# Patient Record
Sex: Male | Born: 1955 | Race: White | Hispanic: No | Marital: Single | Smoking: Former smoker
Health system: Southern US, Community
[De-identification: ages and names within clinical notes are randomized; demographics above are authoritative.]

## PROBLEM LIST (undated history)

## (undated) DIAGNOSIS — K219 Gastro-esophageal reflux disease without esophagitis: Secondary | ICD-10-CM

## (undated) DIAGNOSIS — J939 Pneumothorax, unspecified: Secondary | ICD-10-CM

## (undated) HISTORY — DX: Gastro-esophageal reflux disease without esophagitis: K21.9

---

## 1999-12-19 HISTORY — PX: ANKLE FRACTURE SURGERY: SHX122

## 1999-12-31 ENCOUNTER — Emergency Department (HOSPITAL_COMMUNITY): Admission: EM | Admit: 1999-12-31 | Discharge: 1999-12-31 | Payer: Self-pay | Admitting: Emergency Medicine

## 1999-12-31 ENCOUNTER — Encounter: Payer: Self-pay | Admitting: Emergency Medicine

## 2000-01-01 ENCOUNTER — Ambulatory Visit (HOSPITAL_BASED_OUTPATIENT_CLINIC_OR_DEPARTMENT_OTHER): Admission: RE | Admit: 2000-01-01 | Discharge: 2000-01-01 | Payer: Self-pay | Admitting: Orthopedic Surgery

## 2000-01-09 ENCOUNTER — Ambulatory Visit (HOSPITAL_COMMUNITY): Admission: RE | Admit: 2000-01-09 | Discharge: 2000-01-09 | Payer: Self-pay | Admitting: Emergency Medicine

## 2009-11-02 ENCOUNTER — Emergency Department (HOSPITAL_COMMUNITY): Admission: EM | Admit: 2009-11-02 | Discharge: 2009-11-02 | Payer: Self-pay | Admitting: Emergency Medicine

## 2010-06-05 NOTE — Op Note (Signed)
[place]  Patient:    Ethan Colon, Ethan Colon                     MRN: [id number] Proc. Date: 01/01/00 Adm. Date:  [id number] Attending:  Sandi Raveling                           Operative Report  PREOPERATIVE DIAGNOSIS:  Displaced lateral malleolus fracture.  POSTOPERATIVE DIAGNOSIS:  Displaced lateral malleolus fracture.  OPERATION:  Open reduction and internal fixation lateral malleolus fracture with six-hole Ace titanium small-fragment semitubular plate.  SURGEON:  Sharlot Gowda., M.D.  ANESTHESIA:  General.  TOURNIQUET TIME:  58 minutes.  INDICATIONS:  A 55 year old, displaced lateral malleolus fracture, thought to be amenable to possible overnight hospitalization for fixation.  DESCRIPTION OF PROCEDURE:  Sterile prep and drape.  Exsanguination of the leg and placement through the tourniquet to 350.  Lateral incision made.  The fracture showed moderate posterior displacement and external rotation.  It was irrigated free of clot, reduced essentially anatomically, fixed with the six-hole plate with excellent purchase, distally three screws, including one screw placed with cancellous screw, two screws proximally, the third screw was at the tip of a fracture site and was left open.  Anatomic reduction was confirmed with good position of the screws in the AP, lateral, or oblique planes.  The wound was irrigated.  Vicryl 0 was used to close the deeper subcutaneous tissues, 2-0 Vicryl, skin clips, with Marcaine without epinephrine in the skin, into the joint, a total of 20 cc.  Lightly compressive sterile dressing and posterior plaster splint applied.  Taken to the recovery room in stable condition. DD:  01/01/00 TD:  01/01/00 Job: [id number]

## 2011-07-22 ENCOUNTER — Ambulatory Visit (INDEPENDENT_AMBULATORY_CARE_PROVIDER_SITE_OTHER): Payer: 59 | Admitting: Family Medicine

## 2011-07-22 VITALS — BP 122/78 | HR 77 | Temp 98.4°F | Resp 16 | Ht 72.0 in | Wt 154.0 lb

## 2011-07-22 DIAGNOSIS — J069 Acute upper respiratory infection, unspecified: Secondary | ICD-10-CM

## 2011-07-22 DIAGNOSIS — J329 Chronic sinusitis, unspecified: Secondary | ICD-10-CM

## 2011-07-22 MED ORDER — AMOXICILLIN 875 MG PO TABS: 875.0000 mg | ORAL_TABLET | Freq: Two times a day (BID) | ORAL | Status: AC

## 2011-07-22 MED ORDER — FLUTICASONE PROPIONATE 50 MCG/ACT NA SUSP: 2.0000 | Freq: Every day | NASAL | Status: DC

## 2011-07-22 NOTE — Patient Instructions (Addendum)
If any of fluids and get enough rest. Avoid smoke he, dusty environments.

## 2011-07-22 NOTE — Progress Notes (Signed)
Subjective: 56 year old man with a respiratory tract infection that started over the weekend, Saturday or Sunday. He was feeling a little better by Tuesday but then Tuesday night he got worse. He is continued to have a bad night last night with head congestion, postnasal drainage, some left cervical pain. No problems with his years. The cough is no major factor.  He does have a 54 and a half-year-old child who goes to daycare.  Objective: TMs are normal. Throat clear. Neck supple. Small tender node on the left. Chest is clear to auscultation. Heart regular.  Assessment: URI and sinusitis  Plan: Amoxicillin Flonase nasal spray  Return for

## 2011-07-23 ENCOUNTER — Telehealth: Payer: Self-pay

## 2011-07-23 MED ORDER — GUAIFENESIN-CODEINE 100-10 MG/5ML PO SOLN
ORAL | Status: AC
Start: 2011-07-23 — End: 2011-08-02

## 2011-07-23 NOTE — Telephone Encounter (Signed)
PT STATES HE WAS SEEN YESTERDAY AND WANTED TO MAKE SURE HE HAD TOLD THE DR ABOUT HIS SYMPTONS, STATES HE COUGHED FOR 3 HRS STRAIGHT LAST NIGHT AND FELT MISERABLE. PLEASE CALL [phone]

## 2011-07-23 NOTE — Telephone Encounter (Signed)
Pt CB to report that he didn't give the MD this info yesterday at OV, but he is actually coughing a lot at night - it was worse last night and he coughed for 3 hrs bf he could stop to the point of causing himself to vomit. Pt requests a cough syrup for use at night so that he can sleep. I advised pt to also use Mucinex and drink plenty of water and that propping himself up to sleep can also help some. Can we send a cough syrup to [facility] pharm at [facility]?

## 2011-07-23 NOTE — Telephone Encounter (Signed)
Done and printed

## 2011-07-24 NOTE — Telephone Encounter (Signed)
Pt notified and rx faxed in 

## 2011-09-27 ENCOUNTER — Ambulatory Visit: Payer: 59

## 2011-09-27 ENCOUNTER — Ambulatory Visit (INDEPENDENT_AMBULATORY_CARE_PROVIDER_SITE_OTHER): Payer: 59 | Admitting: Family Medicine

## 2011-09-27 VITALS — BP 135/86 | HR 90 | Temp 98.3°F | Resp 16 | Ht 68.0 in | Wt 192.0 lb

## 2011-09-27 DIAGNOSIS — R059 Cough, unspecified: Secondary | ICD-10-CM

## 2011-09-27 DIAGNOSIS — R05 Cough: Secondary | ICD-10-CM

## 2011-09-27 DIAGNOSIS — J45909 Unspecified asthma, uncomplicated: Secondary | ICD-10-CM

## 2011-09-27 MED ORDER — ALBUTEROL SULFATE (2.5 MG/3ML) 0.083% IN NEBU
2.5000 mg | INHALATION_SOLUTION | Freq: Once | RESPIRATORY_TRACT | Status: AC
  Administered 2011-09-27: 2.5 mg via RESPIRATORY_TRACT

## 2011-09-27 MED ORDER — HYDROCODONE-HOMATROPINE 5-1.5 MG/5ML PO SYRP: ORAL_SOLUTION | ORAL | Status: AC

## 2011-09-27 MED ORDER — AZITHROMYCIN 250 MG PO TABS: ORAL_TABLET | ORAL | Status: AC

## 2011-09-27 MED ORDER — ALBUTEROL SULFATE HFA 108 (90 BASE) MCG/ACT IN AERS: 2.0000 | INHALATION_SPRAY | RESPIRATORY_TRACT | Status: AC | PRN

## 2011-09-27 NOTE — Progress Notes (Signed)
Patient ID: Ethan Colon MRN: [id number], DOB: 1955-05-23, 56 y.o. Date of Encounter: 09/27/2011, 8:13 PM  Primary Physician: No primary provider on file.  Chief Complaint:  Chief Complaint  Patient presents with  . URI    5-6 days  . Cough    HPI: 56 y.o. year old male presents with a five to six day history of nasal congestion, post nasal drip, and cough. No sinus pressure. Afebrile. No chills. Nasal congestion has since resolved. Cough is mostly non-productive and worse at night when he lays down. Cough is keeping him awake at night. Several days prior he did lose his voice with this illness, but has since regained this. Normal hearing. Has tried OTC cold preps without success. No GI complaints. Appetite normal. Wife was ill with same symptoms prior to patient becoming sick with the above. 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 He also mentions to me that many years ago he suffered from a spontaneous pneumothorax on the left side. His symptom at that time was mid back pain. He states that about one week ago he did have a similar pain, although not as bad. This pain has since resolved and he has not had it since. He denies any shortness of breath, dyspnea, wheezing, or chest tightness.   No recent antibiotics, or recent travels.   No leg trauma, sedentary periods, h/o cancer, or tobacco use.  No past medical history on file.   Home Meds: Prior to Admission medications   Medication Sig Start Date End Date Taking? Authorizing Provider  fluticasone (FLONASE) 50 MCG/ACT nasal spray Place 2 sprays into the nose daily. 07/22/11 07/21/12  Peyton Najjar, MD    Allergies:  Allergies  Allergen Reactions  . Erythromycin Other (See Comments)    Abdominal pain    History   Social History  . Marital Status: Single    Spouse Name: N/A    Number of Children: N/A  . Years of Education: N/A   Occupational History  . Not on file.   Social History Main  Topics  . Smoking status: Former Games developer  . Smokeless tobacco: Not on file  . Alcohol Use: Not on file  . Drug Use: Not on file  . Sexually Active: Not on file   Other Topics Concern  . Not on file   Social History Narrative  . No narrative on file     Review of Systems: Constitutional: negative for chills, fever, night sweats or weight changes Cardiovascular: negative for chest pain or palpitations Respiratory: negative for hemoptysis, wheezing, or shortness of breath Abdominal: negative for abdominal pain, nausea, vomiting or diarrhea Dermatological: negative for rash Neurologic: negative for headache   Physical Exam: Blood pressure 135/86, pulse 90, temperature 98.3 F (36.8 C), temperature source Oral, resp. rate 16, height 5\' 8"  (1.727 m), weight 192 lb (87.091 kg)., Body mass index is 29.19 kg/(m^2). General: Well developed, well nourished, in no acute distress. Head: Normocephalic, atraumatic, eyes without discharge, sclera non-icteric, nares are congested. Bilateral auditory canals clear, TM's are without perforation, pearly grey with reflective cone of light bilaterally. No sinus TTP. Oral cavity moist, dentition normal. Posterior pharynx with post nasal drip and mild erythema. No peritonsillar abscess or tonsillar exudate. Neck: Supple. No thyromegaly. Full ROM. No lymphadenopathy. Lungs: Coarse breath sounds bilaterally with mild expiratory wheezing. No rales or rhonchi. Breathing is unlabored.  Heart: RRR with S1 S2. No murmurs, rubs, or gallops appreciated. Msk:  Strength and tone normal for age. Extremities: No clubbing or cyanosis. No edema. Neuro: Alert and oriented X 3. Moves all extremities spontaneously. CNII-XII grossly in tact. Psych:  Responds to questions appropriately with a normal affect.   [facility] reading (PRIMARY) by  Dr. Jenness Corner. Blunted left costophenrenic angle with multiple blebs within the apex of each lung without definite sign of a  pneumothorax.   S/P Albuterol neb: No further wheezing to auscultation.    ASSESSMENT AND PLAN:  56 y.o. year old male with asthmatic bronchitis. -Azithromycin 250 MG #6 2 po first day then 1 po next 4 days no RF -Hycodan #4oz 1 tsp po q 4-6 hours prn cough no RF SED -Proventil 2 puffs inhaled q 4-6 hours prn #1 RF 2 -If no better in 48-72 hours can call for Prednisone 20 mg 3x2, 2x2, 1x2 -Mucinex -Tylenol/Motrin prn -Rest/fluids -RTC precautions -RTC 3-5 days if no improvement  Elinor Dodge, PA-C 09/27/2011 8:13 PM

## 2012-02-04 ENCOUNTER — Emergency Department (HOSPITAL_COMMUNITY)
Admission: EM | Admit: 2012-02-04 | Discharge: 2012-02-05 | Disposition: A | Discharge status: Home / Self Care | Payer: 59 | Attending: Gastroenterology | Admitting: Gastroenterology

## 2012-02-04 ENCOUNTER — Encounter (HOSPITAL_COMMUNITY)
Admission: EM | Disposition: A | Discharge status: Home / Self Care | Payer: Self-pay | Source: Home / Self Care | Attending: Emergency Medicine

## 2012-02-04 ENCOUNTER — Encounter (HOSPITAL_COMMUNITY): Payer: Self-pay | Admitting: *Deleted

## 2012-02-04 DIAGNOSIS — T18108A Unspecified foreign body in esophagus causing other injury, initial encounter: Secondary | ICD-10-CM

## 2012-02-04 DIAGNOSIS — J45909 Unspecified asthma, uncomplicated: Secondary | ICD-10-CM

## 2012-02-04 DIAGNOSIS — R059 Cough, unspecified: Secondary | ICD-10-CM

## 2012-02-04 DIAGNOSIS — Y9389 Activity, other specified: Secondary | ICD-10-CM | POA: Insufficient documentation

## 2012-02-04 DIAGNOSIS — Z87891 Personal history of nicotine dependence: Secondary | ICD-10-CM | POA: Insufficient documentation

## 2012-02-04 DIAGNOSIS — IMO0002 Reserved for concepts with insufficient information to code with codable children: Secondary | ICD-10-CM | POA: Insufficient documentation

## 2012-02-04 DIAGNOSIS — Z79899 Other long term (current) drug therapy: Secondary | ICD-10-CM | POA: Insufficient documentation

## 2012-02-04 DIAGNOSIS — Z7982 Long term (current) use of aspirin: Secondary | ICD-10-CM | POA: Insufficient documentation

## 2012-02-04 DIAGNOSIS — Y929 Unspecified place or not applicable: Secondary | ICD-10-CM | POA: Insufficient documentation

## 2012-02-04 DIAGNOSIS — R05 Cough: Secondary | ICD-10-CM

## 2012-02-04 HISTORY — PX: ESOPHAGOGASTRODUODENOSCOPY: SHX5428

## 2012-02-04 HISTORY — DX: Pneumothorax, unspecified: J93.9

## 2012-02-04 LAB — BASIC METABOLIC PANEL
BUN: 16 mg/dL (ref 6–23)
Calcium: 9.9 mg/dL (ref 8.4–10.5)
Creatinine, Ser: 0.87 mg/dL (ref 0.50–1.35)
GFR calc non Af Amer: >90 mL/min (ref >90)
Glucose, Bld: 103 mg/dL — ABNORMAL HIGH (ref 70–99)
Sodium: 137 mEq/L (ref 135–145)

## 2012-02-04 LAB — CBC WITH DIFFERENTIAL/PLATELET
Eosinophils Absolute: 0.2 10*3/uL (ref 0.0–0.7)
Eosinophils Relative: 2 % (ref 0–5)
Lymphs Abs: 2 10*3/uL (ref 0.7–4.0)
MCH: 33.6 pg (ref 26.0–34.0)
MCV: 91.2 fL (ref 78.0–100.0)
Monocytes Absolute: 0.7 10*3/uL (ref 0.1–1.0)
Monocytes Relative: 7 % (ref 3–12)
Platelets: 356 10*3/uL (ref 150–400)
RBC: 4.88 MIL/uL (ref 4.22–5.81)

## 2012-02-04 SURGERY — EGD (ESOPHAGOGASTRODUODENOSCOPY)
Anesthesia: Moderate Sedation

## 2012-02-04 MED ORDER — SODIUM CHLORIDE 0.9 % IV BOLUS (SEPSIS)
1000.0000 mL | Freq: Once | INTRAVENOUS | Status: AC
  Administered 2012-02-04: 1000 mL via INTRAVENOUS

## 2012-02-04 MED ORDER — ONDANSETRON HCL 4 MG/2ML IJ SOLN
4.0000 mg | Freq: Once | INTRAMUSCULAR | Status: AC
  Administered 2012-02-04: 4 mg via INTRAVENOUS
  Filled 2012-02-04: qty 2

## 2012-02-04 MED ORDER — GLUCAGON HCL (RDNA) 1 MG IJ SOLR
1.0000 mg | Freq: Once | INTRAMUSCULAR | Status: AC
  Administered 2012-02-04: 1 mg via INTRAVENOUS
  Filled 2012-02-04: qty 1

## 2012-02-04 MED ORDER — PANTOPRAZOLE SODIUM 40 MG IV SOLR
40.0000 mg | Freq: Once | INTRAVENOUS | Status: AC
  Administered 2012-02-04: 40 mg via INTRAVENOUS
  Filled 2012-02-04: qty 40

## 2012-02-04 NOTE — ED Provider Notes (Signed)
History     CSN: [id number]  Arrival date & time 02/04/12  2125   First MD Initiated Contact with Patient 02/04/12 2245      Chief Complaint  Patient presents with  . Airway Obstruction    (Consider location/radiation/quality/duration/timing/severity/associated sxs/prior treatment) HPI.... sensation of foreign body stuck in his throat.  Patient was eating steak at noon today when he felt like food bolus stopped midway down his esophagus. He has been able to swallow fluids since that time. No airway issues. He is generally healthy.  History reviewed. No pertinent past medical history.  History reviewed. No pertinent past surgical history.  History reviewed. No pertinent family history.  History  Substance Use Topics  . Smoking status: Former Games developer  . Smokeless tobacco: Not on file  . Alcohol Use: Yes      Review of Systems  All other systems reviewed and are negative.    Allergies  Erythromycin  Home Medications   Current Outpatient Rx  Name  Route  Sig  Dispense  Refill  . ASPIRIN EC 81 MG PO TBEC   Oral   Take 81 mg by mouth daily.         Marland Kitchen CALCIUM 1200 PO   Oral   Take 1 tablet by mouth daily.         . OMEGA-3-ACID ETHYL ESTERS 1 G PO CAPS   Oral   Take 1 g by mouth daily.         . ALBUTEROL SULFATE HFA 108 (90 BASE) MCG/ACT IN AERS   Inhalation   Inhale 2 puffs into the lungs every 4 (four) hours as needed for wheezing.   1 Inhaler   2     BP 170/93  Pulse 72  Temp 98.2 F (36.8 C) (Oral)  Resp 18  SpO2 96%  Physical Exam  Nursing note and vitals reviewed. Constitutional: He is oriented to person, place, and time. He appears well-developed and well-nourished.  HENT:  Head: Normocephalic and atraumatic.  Eyes: Conjunctivae normal and EOM are normal. Pupils are equal, round, and reactive to light.  Neck: Normal range of motion. Neck supple.  Cardiovascular: Normal rate, regular rhythm and normal heart sounds.   Pulmonary/Chest:  Effort normal and breath sounds normal.  Abdominal: Soft. Bowel sounds are normal.  Musculoskeletal: Normal range of motion.  Neurological: He is alert and oriented to person, place, and time.  Skin: Skin is warm and dry.  Psychiatric: He has a normal mood and affect.    ED Course  Procedures (including critical care time)   Labs Reviewed  CBC WITH DIFFERENTIAL  BASIC METABOLIC PANEL   No results found.   1. Foreign body in esophagus       MDM  IV fluids, IV glucagon, discussions with the gastroenterologist Dr Dorena Cookey.  He will evaluate patient        Donnetta Hutching, MD 02/04/12 2318

## 2012-02-04 NOTE — ED Notes (Signed)
Pt states ate steak for lunch, has had foreign body sensation in throat since first eating. Pt states he has "vomited" x 25 times. Pt states unable to swallow saliva. Pt is in no acute respiratory distress at this time. Pt is unable to eat or drink.

## 2012-02-04 NOTE — ED Notes (Signed)
Patient transported to Endoscopy for procedure with RN

## 2012-02-05 ENCOUNTER — Encounter (HOSPITAL_COMMUNITY)
Admission: EM | Disposition: A | Discharge status: Home / Self Care | Payer: Self-pay | Source: Home / Self Care | Attending: Emergency Medicine

## 2012-02-05 ENCOUNTER — Encounter (HOSPITAL_COMMUNITY): Payer: Self-pay | Admitting: *Deleted

## 2012-02-05 HISTORY — PX: ESOPHAGOGASTRODUODENOSCOPY: SHX5428

## 2012-02-05 SURGERY — EGD (ESOPHAGOGASTRODUODENOSCOPY)
Anesthesia: Moderate Sedation

## 2012-02-05 MED ORDER — MIDAZOLAM HCL 10 MG/2ML IJ SOLN
INTRAMUSCULAR | Status: DC | PRN
  Administered 2012-02-05 (×2): 2 mg via INTRAVENOUS

## 2012-02-05 MED ORDER — BUTAMBEN-TETRACAINE-BENZOCAINE 2-2-14 % EX AERO
INHALATION_SPRAY | CUTANEOUS | Status: DC | PRN
  Administered 2012-02-05: 1 via TOPICAL

## 2012-02-05 MED ORDER — FENTANYL CITRATE 0.05 MG/ML IJ SOLN
INTRAMUSCULAR | Status: DC | PRN
  Administered 2012-02-05 (×2): 25 ug via INTRAVENOUS

## 2012-02-05 NOTE — H&P (Signed)
[facility] Consult Note  Referring Provider: No ref. provider found Primary Care Physician:  No primary provider on file. Primary Gastroenterologist:  Dr.  Antony Contras Complaint: Feeling of food stuck in the esophagus HPI: Ethan Colon is an 57 y.o. white male  was eating steak and salad at 12 and then and felt the food bolus lodged in his esophagus and has been unable to clear it and unable to swallow his saliva sounds. He has had minor episodes of dysphagia in the past but never has had a prolonged impaction requiring her to come to the emergency room.  Past Medical History  Diagnosis Date  . Pneumothorax     History reviewed. No pertinent past surgical history.  Medications Prior to Admission  Medication Sig Dispense Refill  . aspirin EC 81 MG tablet Take 81 mg by mouth daily.      . Calcium Carbonate-Vit D-Min (CALCIUM 1200 PO) Take 1 tablet by mouth daily.      Marland Kitchen omega-3 acid ethyl esters (LOVAZA) 1 G capsule Take 1 g by mouth daily.      Marland Kitchen albuterol (PROVENTIL HFA;VENTOLIN HFA) 108 (90 BASE) MCG/ACT inhaler Inhale 2 puffs into the lungs every 4 (four) hours as needed for wheezing.  1 Inhaler  2    Allergies:  Allergies  Allergen Reactions  . Erythromycin Other (See Comments)    Abdominal pain    History reviewed. No pertinent family history.  Social History:  reports that he has quit smoking. He does not have any smokeless tobacco history on file. He reports that he drinks alcohol. He reports that he does not use illicit drugs.  Review of Systems: negative except as above   Blood pressure 170/93, pulse 72, temperature 98.2 F (36.8 C), temperature source Oral, resp. rate 18, SpO2 96.00%. Head: Normocephalic, without obvious abnormality, atraumatic Neck: no adenopathy, no carotid bruit, no JVD, supple, symmetrical, trachea midline and thyroid not enlarged, symmetric, no tenderness/mass/nodules Resp: clear to auscultation bilaterally Cardio: regular rate  and rhythm, S1, S2 normal, no murmur, click, rub or gallop GI: Abdomen soft nondistended with normoactive bowel sounds Extremities: extremities normal, atraumatic, no cyanosis or edema  Results for orders placed during the hospital encounter of 02/04/12 (from the past 48 hour(s))  CBC WITH DIFFERENTIAL     Status: Abnormal   Collection Time   02/04/12 11:00 PM      Component Value Range Comment   WBC 9.6  4.0 - 10.5 K/uL    RBC 4.88  4.22 - 5.81 MIL/uL    Hemoglobin 16.4  13.0 - 17.0 g/dL    HCT 16.1  09.6 - 04.5 %    MCV 91.2  78.0 - 100.0 fL    MCH 33.6  26.0 - 34.0 pg    MCHC 36.9 (*) 30.0 - 36.0 g/dL    RDW 40.9  81.1 - 91.4 %    Platelets 356  150 - 400 K/uL    Neutrophils Relative 70  43 - 77 %    Neutro Abs 6.8  1.7 - 7.7 K/uL    Lymphocytes Relative 21  12 - 46 %    Lymphs Abs 2.0  0.7 - 4.0 K/uL    Monocytes Relative 7  3 - 12 %    Monocytes Absolute 0.7  0.1 - 1.0 K/uL    Eosinophils Relative 2  0 - 5 %    Eosinophils Absolute 0.2  0.0 - 0.7 K/uL    Basophils Relative 1  0 - 1 %    Basophils Absolute 0.1  0.0 - 0.1 K/uL   BASIC METABOLIC PANEL     Status: Abnormal   Collection Time   02/04/12 11:00 PM      Component Value Range Comment   Sodium 137  135 - 145 mEq/L    Potassium 4.1  3.5 - 5.1 mEq/L    Chloride 99  96 - 112 mEq/L    CO2 27  19 - 32 mEq/L    Glucose, Bld 103 (*) 70 - 99 mg/dL    BUN 16  6 - 23 mg/dL    Creatinine, Ser 4.01  0.50 - 1.35 mg/dL    Calcium 9.9  8.4 - 02.7 mg/dL    GFR calc non Af Amer >90  >90 mL/min    GFR calc Af Amer >90  >90 mL/min    No results found.  Assessment: Food impaction in the esophagus unable to clear spontaneously I Plan:  Will proceed with EGD with attempted removal. Yaniv Lage C 02/05/2012, 12:11 AM

## 2012-02-05 NOTE — Op Note (Signed)
[facility] [address] [place], [address]   ayesENDOSCOPY PROCEDURE REPORT  PATIENT: Ethan Colon, Ethan Colon  MR#: [id number] BIRTHDATE: 1955-04-16 , 56  yrs. old GENDER: Male ENDOSCOPIST:Cleave Ternes Madilyn Fireman, MD REFERRED BY: PROCEDURE DATE:  02/05/2012 PROCEDURE: ASA CLASS: INDICATIONS:  esophageal food impaction MEDICATION:    fentanyl 50 mcg, Versed 4 mg TOPICAL ANESTHETIC:  DESCRIPTION OF PROCEDURE:   esophagus filled with fluid with distal meat bolus pushed through the GE junction into the stomach with friable surrounding mucosa no obvious laceration a probable patent lower esophageal ring with fracture with the scope. Stomach was normal. Duodenum was not examined     COMPLICATIONS: None  ENDOSCOPIC IMPRESSION:sessile removal of foreign body from esophagus  RECOMMENDATIONS: with underlying esophageal ringplan is to followup with me in the office and to arrange elective dilatation.    _______________________________ Rosalie DoctorDorena Cookey, MD 02/05/2012 12:45 AM

## 2012-02-05 NOTE — ED Notes (Signed)
Pt returned from Endoscopy unit. Pt is alert/oriented. Will continue to monitor. Pt spouse will be coming to take him home. Pt given water, able to swallow without difficulty

## 2012-02-05 NOTE — ED Notes (Signed)
[id number]<AV> Expected date:<BR> Expected time:<BR> Means of arrival:<BR> Comments:<BR> ENDO PT

## 2012-02-08 ENCOUNTER — Encounter (HOSPITAL_COMMUNITY): Payer: Self-pay | Admitting: Gastroenterology

## 2012-02-09 MED FILL — Diphenhydramine HCl Inj 50 MG/ML: INTRAMUSCULAR | Qty: 1 | Status: AC

## 2012-03-04 ENCOUNTER — Other Ambulatory Visit: Payer: Self-pay

## 2012-10-09 ENCOUNTER — Ambulatory Visit (INDEPENDENT_AMBULATORY_CARE_PROVIDER_SITE_OTHER): Payer: 59 | Admitting: Emergency Medicine

## 2012-10-09 VITALS — BP 128/82 | HR 71 | Temp 98.9°F | Resp 17 | Ht 68.0 in | Wt 185.0 lb

## 2012-10-09 DIAGNOSIS — J018 Other acute sinusitis: Secondary | ICD-10-CM

## 2012-10-09 MED ORDER — PSEUDOEPHEDRINE-GUAIFENESIN ER 60-600 MG PO TB12: 1.0000 | ORAL_TABLET | Freq: Two times a day (BID) | ORAL | Status: AC

## 2012-10-09 MED ORDER — AMOXICILLIN-POT CLAVULANATE 875-125 MG PO TABS: 1.0000 | ORAL_TABLET | Freq: Two times a day (BID) | ORAL | Status: DC

## 2012-10-09 NOTE — Patient Instructions (Addendum)
Hoarseness Hoarseness is produced from a variety of causes. It is important to find the cause so it can be treated. In the absence of a cold or upper respiratory illness, any hoarseness lasting more than 2 weeks should be looked at by a specialist. This is especially important if you have a history of smoking or alcohol use. It is also important to keep in mind that as you grow older, your voice will naturally get weaker, making it easier for you to become hoarse from straining your vocal cords.  CAUSES  Any illness that affects your vocal cords can result in a hoarse voice. Examples of conditions that can affect the vocal cords are listed as follows:   Allergies.  Colds.  Sinusitis.  Gastroesophageal reflux disease.  Croup.  Injury.  Nodules.  Exposure to smoke or toxic fumes or gases.  Congenital and genetic defects.  Paralysis of the vocal cords.  Infections.  Advanced age. DIAGNOSIS  In order to diagnose the cause of your hoarseness, your caregiver will examine your throat using an instrument that uses a tube with a small lighted camera (laryngoscope). It allows your caregiver to look into the mouth and down the throat. TREATMENT  For most cases, treatment will focus on the specific cause of the hoarseness. Depending on the cause, hoarseness can be a temporary condition (acute) or it can be long lasting (chronic). Most cases of hoarseness clear up without complications. Your caregiver will explain to you if this is not likely to happen. SEEK IMMEDIATE MEDICAL CARE IF:   You have increasing hoarseness or loss of voice.  You have shortness of breath.  You are coughing up blood.  There is pain in your neck or throat. Document Released: 12/18/2004 Document Revised: 03/29/2011 Document Reviewed: 03/12/2010 [facility] Patient Information 2014 [place], [place]. Sinusitis Sinusitis is redness, soreness, and swelling (inflammation) of the paranasal sinuses. Paranasal sinuses are  air pockets within the bones of your face (beneath the eyes, the middle of the forehead, or above the eyes). In healthy paranasal sinuses, mucus is able to drain out, and air is able to circulate through them by way of your nose. However, when your paranasal sinuses are inflamed, mucus and air can become trapped. This can allow bacteria and other germs to grow and cause infection. Sinusitis can develop quickly and last only a short time (acute) or continue over a long period (chronic). Sinusitis that lasts for more than 12 weeks is considered chronic.  CAUSES  Causes of sinusitis include:  Allergies.  Structural abnormalities, such as displacement of the cartilage that separates your nostrils (deviated septum), which can decrease the air flow through your nose and sinuses and affect sinus drainage.  Functional abnormalities, such as when the small hairs (cilia) that line your sinuses and help remove mucus do not work properly or are not present. SYMPTOMS  Symptoms of acute and chronic sinusitis are the same. The primary symptoms are pain and pressure around the affected sinuses. Other symptoms include:  Upper toothache.  Earache.  Headache.  Bad breath.  Decreased sense of smell and taste.  A cough, which worsens when you are lying flat.  Fatigue.  Fever.  Thick drainage from your nose, which often is green and may contain pus (purulent).  Swelling and warmth over the affected sinuses. DIAGNOSIS  Your caregiver will perform a physical exam. During the exam, your caregiver may:  Look in your nose for signs of abnormal growths in your nostrils (nasal polyps).  Tap over  the affected sinus to check for signs of infection.  View the inside of your sinuses (endoscopy) with a special imaging device with a light attached (endoscope), which is inserted into your sinuses. If your caregiver suspects that you have chronic sinusitis, one or more of the following tests may be  recommended:  Allergy tests.  Nasal culture A sample of mucus is taken from your nose and sent to a lab and screened for bacteria.  Nasal cytology A sample of mucus is taken from your nose and examined by your caregiver to determine if your sinusitis is related to an allergy. TREATMENT  Most cases of acute sinusitis are related to a viral infection and will resolve on their own within 10 days. Sometimes medicines are prescribed to help relieve symptoms (pain medicine, decongestants, nasal steroid sprays, or saline sprays).  However, for sinusitis related to a bacterial infection, your caregiver will prescribe antibiotic medicines. These are medicines that will help kill the bacteria causing the infection.  Rarely, sinusitis is caused by a fungal infection. In theses cases, your caregiver will prescribe antifungal medicine. For some cases of chronic sinusitis, surgery is needed. Generally, these are cases in which sinusitis recurs more than 3 times per year, despite other treatments. HOME CARE INSTRUCTIONS   Drink plenty of water. Water helps thin the mucus so your sinuses can drain more easily.  Use a humidifier.  Inhale steam 3 to 4 times a day (for example, sit in the bathroom with the shower running).  Apply a warm, moist washcloth to your face 3 to 4 times a day, or as directed by your caregiver.  Use saline nasal sprays to help moisten and clean your sinuses.  Take over-the-counter or prescription medicines for pain, discomfort, or fever only as directed by your caregiver. SEEK IMMEDIATE MEDICAL CARE IF:  You have increasing pain or severe headaches.  You have nausea, vomiting, or drowsiness.  You have swelling around your face.  You have vision problems.  You have a stiff neck.  You have difficulty breathing. MAKE SURE YOU:   Understand these instructions.  Will watch your condition.  Will get help right away if you are not doing well or get worse. Document  Released: 01/04/2005 Document Revised: 03/29/2011 Document Reviewed: 01/19/2011 [facility] Patient Information 2014 [place], [place].

## 2012-10-09 NOTE — Progress Notes (Signed)
[facility] [address], [place] [address] [phone]  Date:  10/09/2012   Name:  Ethan Colon   DOB:  Apr 24, 1955   MRN:  [id number]  PCP:  No primary provider on file.    Chief Complaint: Laryngitis   History of Present Illness:  Ethan Colon is a 57 y.o. very pleasant male patient who presents with the following:  Ill since Friday with hoarseness.  Has no dysphagia.  Has mucoid nasal discharge and a post nasal drainage with a thick mucopurulent drainage.  No fever or chills.  No nausea or vomiting.  No stool change,  Hoarse.  Productive cough. Had a food impaction in his distal esophagus in January and failed to follow up.  Has waterbrash at times no heartburn.  No fever or chills.  No food intolerance.  No improvement with over the counter medications or other home remedies. Denies other complaint or health concern today.   There are no active problems to display for this patient.   Past Medical History  Diagnosis Date  . Pneumothorax   . GERD (gastroesophageal reflux disease)     Past Surgical History  Procedure Laterality Date  . Esophagogastroduodenoscopy  02/04/2012    Procedure: ESOPHAGOGASTRODUODENOSCOPY (EGD);  Surgeon: Barrie Folk, MD;  Location: [facility];  Service: Endoscopy;  Laterality: N/A;  . Esophagogastroduodenoscopy  02/05/2012    Procedure: ESOPHAGOGASTRODUODENOSCOPY (EGD);  Surgeon: Barrie Folk, MD;  Location: [facility];  Service: Endoscopy;  Laterality: N/A;    History  Substance Use Topics  . Smoking status: Former Games developer  . Smokeless tobacco: Not on file  . Alcohol Use: Yes     Comment: 2 Drinks daily    History reviewed. No pertinent family history.  Allergies  Allergen Reactions  . Erythromycin Other (See Comments)    Abdominal pain    Medication list has been reviewed and updated.  Current Outpatient Prescriptions on File Prior to Visit  Medication Sig Dispense Refill  . albuterol (PROVENTIL  HFA;VENTOLIN HFA) 108 (90 BASE) MCG/ACT inhaler Inhale 2 puffs into the lungs every 4 (four) hours as needed for wheezing.  1 Inhaler  2  . aspirin EC 81 MG tablet Take 81 mg by mouth daily.      . Calcium Carbonate-Vit D-Min (CALCIUM 1200 PO) Take 1 tablet by mouth daily.      Marland Kitchen omega-3 acid ethyl esters (LOVAZA) 1 G capsule Take 1 g by mouth daily.       No current facility-administered medications on file prior to visit.    Review of Systems:  As per HPI, otherwise negative.    Physical Examination: Filed Vitals:   10/09/12 1115  BP: 128/82  Pulse: 71  Temp: 98.9 F (37.2 C)  Resp: 17   Filed Vitals:   10/09/12 1115  Height: 5\' 8"  (1.727 m)  Weight: 185 lb (83.915 kg)   Body mass index is 28.14 kg/(m^2). Ideal Body Weight: Weight in (lb) to have BMI = 25: 164.1  GEN: WDWN, NAD, Non-toxic, A & O x 3 HEENT: Atraumatic, Normocephalic. Neck supple. No masses, No LAD. Ears and Nose: No external deformity.  Purulent nasal drainage CV: RRR, No M/G/R. No JVD. No thrill. No extra heart sounds. PULM: CTA B, no wheezes, crackles, rhonchi. No retractions. No resp. distress. No accessory muscle use. ABD: S, NT, ND, +BS. No rebound. No HSM. EXTR: No c/c/e NEURO Normal gait.  PSYCH: Normally interactive. Conversant. Not depressed or anxious  appearing.  Calm demeanor.    Assessment and Plan: Sinusitis augmentin mucinex d  Signed,  Phillips Odor, MD

## 2012-11-23 ENCOUNTER — Other Ambulatory Visit: Payer: Self-pay

## 2013-07-30 ENCOUNTER — Ambulatory Visit (INDEPENDENT_AMBULATORY_CARE_PROVIDER_SITE_OTHER): Payer: 59 | Admitting: Family Medicine

## 2013-07-30 ENCOUNTER — Ambulatory Visit (INDEPENDENT_AMBULATORY_CARE_PROVIDER_SITE_OTHER): Payer: 59

## 2013-07-30 VITALS — BP 142/74 | HR 78 | Temp 97.9°F | Resp 15 | Ht 67.5 in | Wt 190.6 lb

## 2013-07-30 DIAGNOSIS — M546 Pain in thoracic spine: Secondary | ICD-10-CM

## 2013-07-30 MED ORDER — DICLOFENAC SODIUM 75 MG PO TBEC: 75.0000 mg | DELAYED_RELEASE_TABLET | Freq: Two times a day (BID) | ORAL | Status: DC

## 2013-07-30 MED ORDER — CYCLOBENZAPRINE HCL 5 MG PO TABS: ORAL_TABLET | ORAL | Status: DC

## 2013-07-30 NOTE — Progress Notes (Signed)
Subjective: 58 year old male who is here with a back pain that has been nagging him for the last week or so. No specific injury. 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 He has to oral a bag of steaks around with him that he drives and around the hammer. He has been hurting in the mid back between the lower aspect of the scapula. Mostly midline but seems a little more to the right at times. He has had some pains in his shoulder and arm. When his wife rubbed it last week it seemed to be a little more tender. However the pains come and go, shooting pains. He is taken some ibuprofen. He has continued working. He also wanted to make sure the moles on his back looked okay.  And anxiety in the family comes from the fact that his sister-in-law had mid back pains that is not being an aortic aneurysm.  Objective: Healthy-appearing man in no major distress at this time. Good range of motion of upper extremities with good strength. Normal range of motion of the neck, but does feel some crepitance when he moves around. Thoracic spine essentially nontender. Maybe minimal tenderness just to the right of midline at about the T9 or T10 level. Abdomen soft. The skin has a lot of little freckle-like moles on the back. Is 1 area of coarse her skin just to the left of midline in the mid back that appears to be early seborrheic keratosis.  Assessment: Back pain, mid back Nonspecific nevi, benign in appearance Probable seborrheic keratosis, benign in appearance  Plan: [facility] reading (PRIMARY) by  Dr. Alwyn RenHopper Normal t spine .   Thoracic spine x-ray

## 2013-07-30 NOTE — Patient Instructions (Addendum)
Take diclofenac one twice daily at breakfast and supper for pain and inflammation of back  If you have additional pain you can take acetaminophen/Tylenol but avoid ibuprofen and Aleve type products while on this diclofenac  The cyclobenzaprine is a muscle relaxant take one or 2 at bedtime. It often causes some daytime drowsiness  Return if not improving  Read the Back Owner's Manual

## 2013-09-22 IMAGING — CR DG CHEST 2V
2 series · 2 of 2 positions shown · non-contrast
Comparison: None.

CLINICAL DATA: Cough

CHEST - 2 VIEW

[PA]
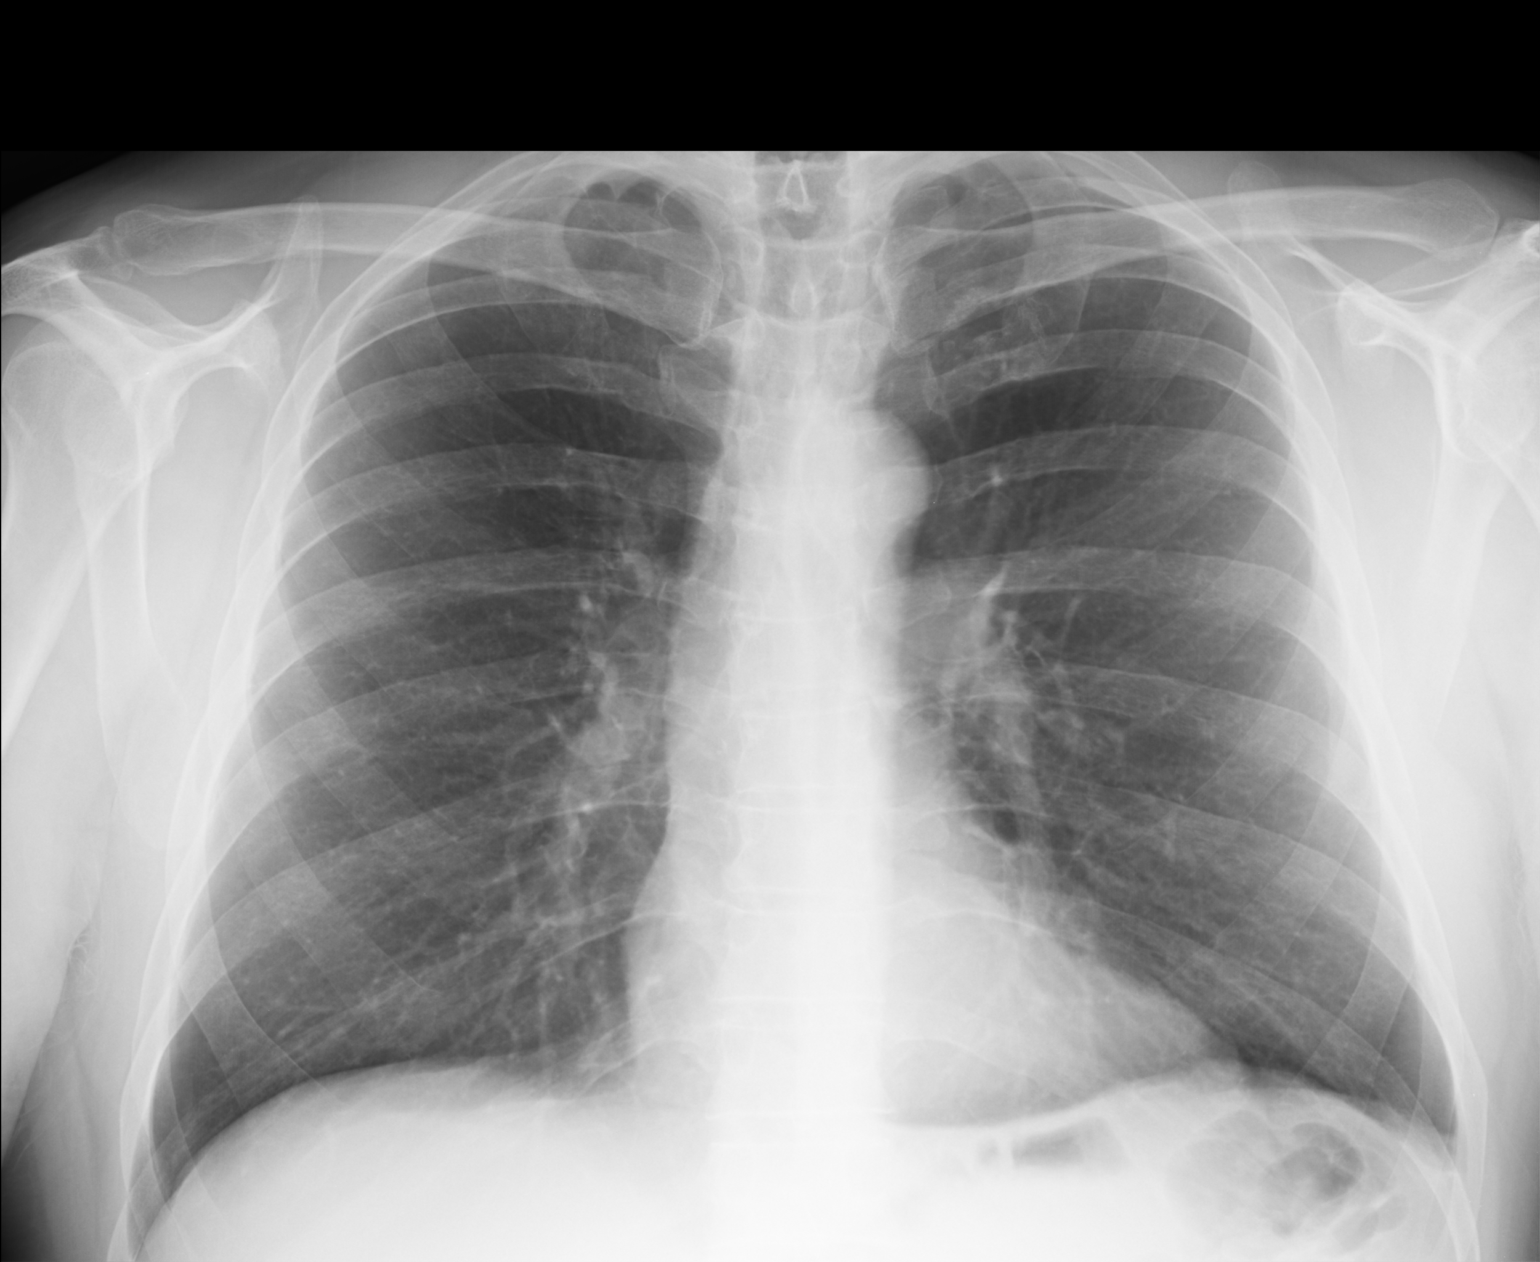

[lateral]
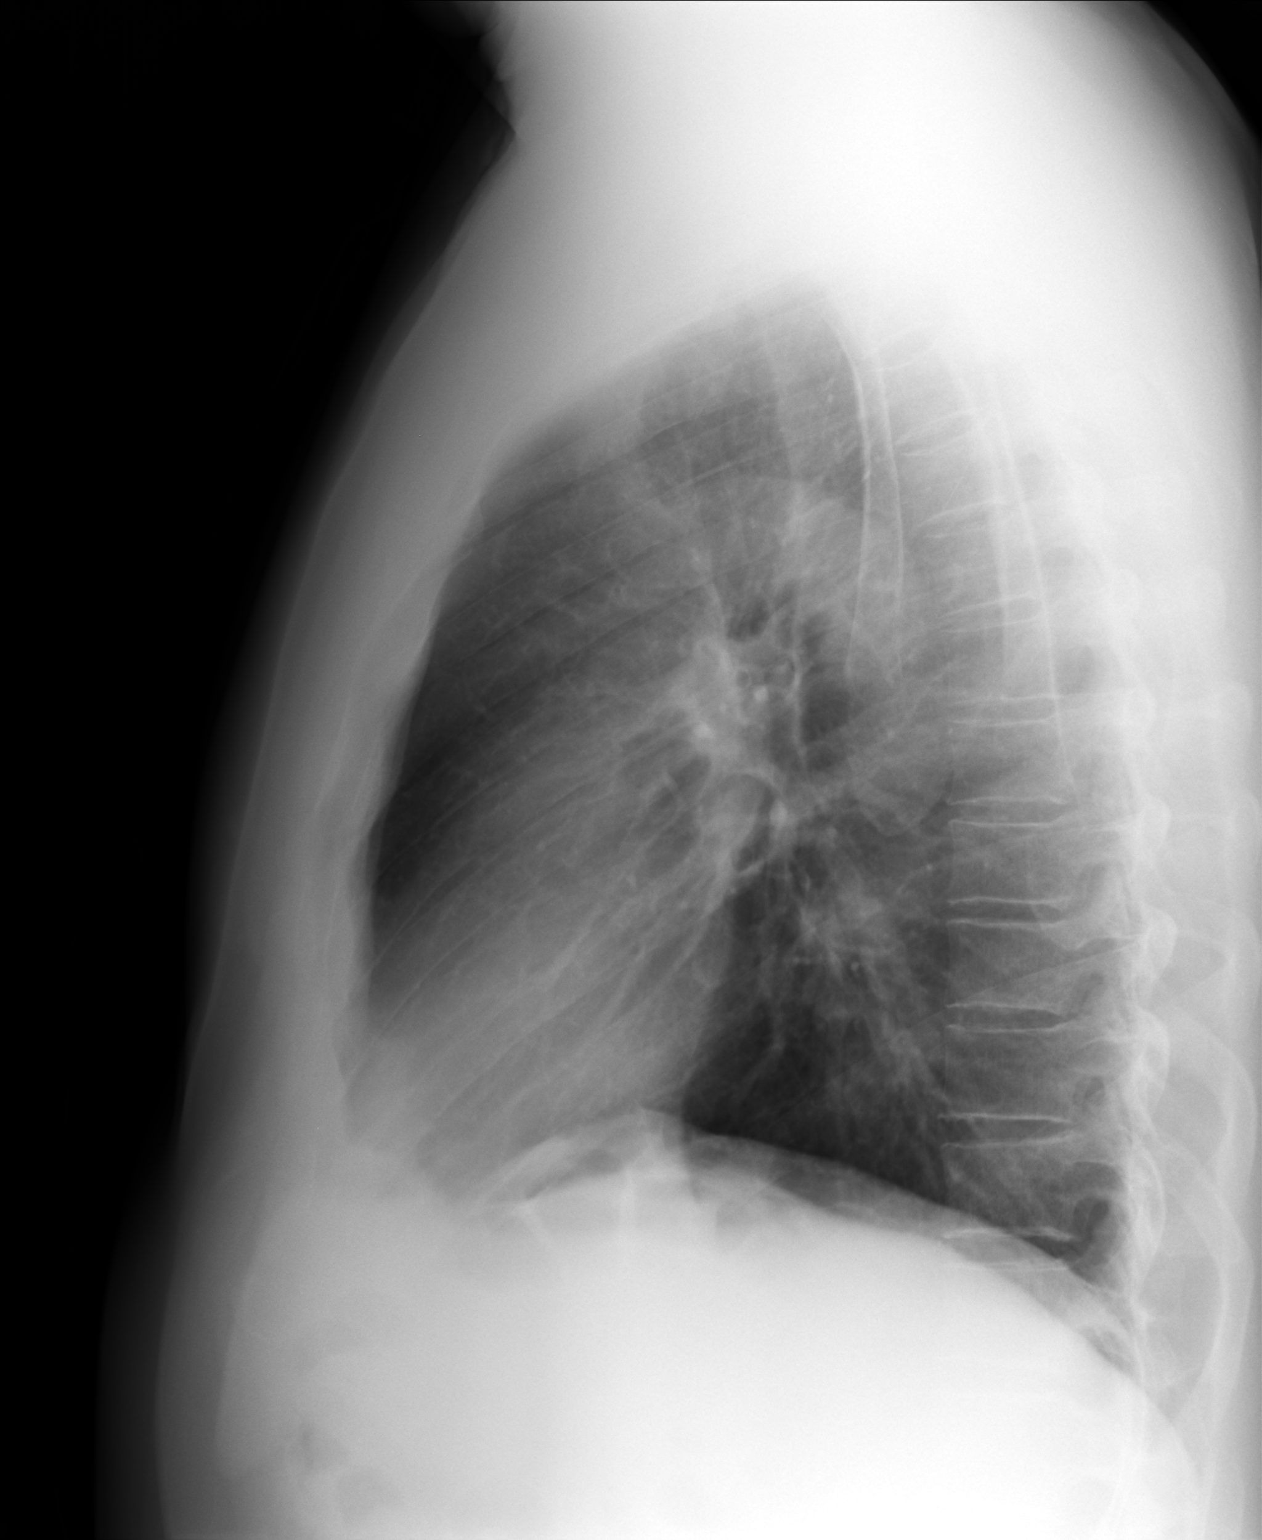

[2 of 2 positions shown; findings below may reference images not displayed]

FINDINGS: No active infiltrate or effusion is seen.  There may be
small blebs in the right lung apex.  No pneumothorax is seen.  The
heart is within normal limits in size.  No bony abnormality is
seen.
IMPRESSION: No active lung disease.  Question small blebs in the right lung
apex.

Clinically significant discrepancy from primary report, if
provided: None

## 2014-11-26 ENCOUNTER — Ambulatory Visit (INDEPENDENT_AMBULATORY_CARE_PROVIDER_SITE_OTHER): Payer: 59 | Admitting: Family Medicine

## 2014-11-26 VITALS — BP 126/82 | HR 98 | Temp 97.8°F | Resp 18 | Ht 68.0 in | Wt 193.0 lb

## 2014-11-26 DIAGNOSIS — J069 Acute upper respiratory infection, unspecified: Secondary | ICD-10-CM

## 2014-11-26 DIAGNOSIS — R091 Pleurisy: Secondary | ICD-10-CM

## 2014-11-26 MED ORDER — IPRATROPIUM BROMIDE 0.03 % NA SOLN: 2.0000 | Freq: Four times a day (QID) | NASAL | Status: AC

## 2014-11-26 MED ORDER — DEXTROMETHORPHAN POLISTIREX ER 30 MG/5ML PO SUER: 60.0000 mg | Freq: Two times a day (BID) | ORAL | Status: AC | PRN

## 2014-11-26 MED ORDER — HYDROCOD POLST-CPM POLST ER 10-8 MG/5ML PO SUER: 5.0000 mL | Freq: Two times a day (BID) | ORAL | Status: AC | PRN

## 2014-11-26 MED ORDER — PSEUDOEPHEDRINE HCL ER 120 MG PO TB12: 120.0000 mg | ORAL_TABLET | Freq: Two times a day (BID) | ORAL | Status: AC

## 2014-11-26 NOTE — Progress Notes (Signed)
Subjective:  This chart was scribed for Norberto Sorenson, MD by Broadus John, Medical Scribe. This patient was seen in Room 10 and the patient's care was started at 1:36 PM.   Patient ID: Ethan Colon, male    DOB: Dec 09, 1955, 59 y.o.   MRN: [id number]  Chief Complaint  Patient presents with  . Cough    running nose, started sunday  . Sore Throat    HPI HPI Comments: Ethan Colon is a 59 y.o. male who presents to [facility] complaining of unproductive cough, onset yesterday.  Pt notes that symptoms of chills, sore throat, and rhinorrhea about 3 days ago. He indicates sleep disturbance secondary to the cough, as well as central chest tenderness associated with the cough. Pt started taking a medication for a cough this morning. Pt denies shortness of breath, or wheezing. Pt notes an episode of left sided chest pain when taking a deep breath that he possibly attributes to his hx of pneumothorax.Pt has a hx of smoking, quit 5 years ago.   Pt is not UTD with the flu vaccination this year.    There are no active problems to display for this patient.  Past Medical History  Diagnosis Date  . Pneumothorax   . GERD (gastroesophageal reflux disease)    Past Surgical History  Procedure Laterality Date  . Esophagogastroduodenoscopy  02/04/2012    Procedure: ESOPHAGOGASTRODUODENOSCOPY (EGD);  Surgeon: Barrie Folk, MD;  Location: [facility];  Service: Endoscopy;  Laterality: N/A;  . Esophagogastroduodenoscopy  02/05/2012    Procedure: ESOPHAGOGASTRODUODENOSCOPY (EGD);  Surgeon: Barrie Folk, MD;  Location: [facility];  Service: Endoscopy;  Laterality: N/A;  . Ankle fracture surgery Right 12/1999   Allergies  Allergen Reactions  . Erythromycin Other (See Comments)    Abdominal pain   Prior to Admission medications   Medication Sig Start Date End Date Taking? Authorizing Provider  albuterol (PROVENTIL HFA;VENTOLIN HFA) 108 (90 BASE) MCG/ACT inhaler Inhale 2 puffs  into the lungs every 4 (four) hours as needed for wheezing. 09/27/11 10/09/12  Raymon Mutton Dunn, PA-C  amoxicillin-clavulanate (AUGMENTIN) 875-125 MG per tablet Take 1 tablet by mouth 2 (two) times daily. Patient not taking: Reported on 11/26/2014 10/09/12   Carmelina Dane, MD  aspirin EC 81 MG tablet Take 81 mg by mouth daily.    Historical Provider, MD  Calcium Carbonate-Vit D-Min (CALCIUM 1200 PO) Take 1 tablet by mouth daily.    Historical Provider, MD  cyclobenzaprine (FLEXERIL) 5 MG tablet Take one or two at bedtime for muscle relaxant Patient not taking: Reported on 11/26/2014 07/30/13   Peyton Najjar, MD  Dexlansoprazole (DEXILANT PO) Take 1 tablet by mouth once a week. PATIENT UNSURE OF STRENGTH    Historical Provider, MD  diclofenac (VOLTAREN) 75 MG EC tablet Take 1 tablet (75 mg total) by mouth 2 (two) times daily. Patient not taking: Reported on 11/26/2014 07/30/13   Peyton Najjar, MD  MULTIPLE VITAMIN PO Take 1 tablet by mouth daily.    Historical Provider, MD  omega-3 acid ethyl esters (LOVAZA) 1 G capsule Take 1 g by mouth daily.    Historical Provider, MD   Social History   Social History  . Marital Status: Single    Spouse Name: N/A  . Number of Children: N/A  . Years of Education: N/A   Occupational History  . Not on file.   Social History Main Topics  . Smoking status: Former Games developer  .  Smokeless tobacco: Not on file  . Alcohol Use: Yes     Comment: 2 Drinks daily  . Drug Use: No  . Sexual Activity: Yes    Birth Control/ Protection: None   Other Topics Concern  . Not on file   Social History Narrative    Review of Systems  Constitutional: Positive for chills.  HENT: Positive for rhinorrhea and sore throat.   Respiratory: Positive for cough. Negative for shortness of breath and wheezing.   Cardiovascular: Positive for chest pain.  Psychiatric/Behavioral: Positive for sleep disturbance.       Objective:   Physical Exam  Constitutional: He is oriented to  person, place, and time. He appears well-developed and well-nourished. No distress.  HENT:  Head: Normocephalic and atraumatic.  Right Ear: Tympanic membrane is retracted.  Left Ear: Tympanic membrane is erythematous and retracted.  Nasal mucosal edema with rhinorrhea.  Erythema of the oropharynx.    Eyes: EOM are normal. Pupils are equal, round, and reactive to light.  Neck: Neck supple. No thyroid mass and no thyromegaly present.  Cardiovascular: Normal rate, regular rhythm, S1 normal, S2 normal and normal heart sounds.  Exam reveals no gallop and no friction rub.   No murmur heard. Pulmonary/Chest: Effort normal.  Lymphadenopathy:    He has no cervical adenopathy.  Neurological: He is alert and oriented to person, place, and time. No cranial nerve deficit.  Skin: Skin is warm and dry.  Psychiatric: He has a normal mood and affect. His behavior is normal.  Nursing note and vitals reviewed.   BP 126/82 mmHg  Pulse 98  Temp(Src) 97.8 F (36.6 C) (Oral)  Resp 18  Ht 5\' 8"  (1.727 m)  Wt 193 lb (87.544 kg)  BMI 29.35 kg/m2  SpO2 98%     Assessment & Plan:   1. Acute upper respiratory infection   2. Pleuritis     Meds ordered this encounter  Medications  . pseudoephedrine (SUDAFED 12 HOUR) 120 MG 12 hr tablet    Sig: Take 1 tablet (120 mg total) by mouth 2 (two) times daily.    Dispense:  30 tablet    Refill:  0  . ipratropium (ATROVENT) 0.03 % nasal spray    Sig: Place 2 sprays into the nose 4 (four) times daily.    Dispense:  30 mL    Refill:  1  . chlorpheniramine-HYDROcodone (TUSSIONEX PENNKINETIC ER) 10-8 MG/5ML SUER    Sig: Take 5 mLs by mouth every 12 (twelve) hours as needed for cough (-primary use at night).    Dispense:  129 mL    Refill:  0  . dextromethorphan (DELSYM) 30 MG/5ML liquid    Sig: Take 10 mLs (60 mg total) by mouth 2 (two) times daily as needed for cough (primarily qam cough).    Dispense:  150 mL    Refill:  0    I personally performed  the services described in this documentation, which was scribed in my presence. The recorded information has been reviewed and considered, and addended by me as needed.  Norberto SorensonEva Shaw, MD MPH  By signing my name below, I, Rawaa Al Rifaie, attest that this documentation has been prepared under the direction and in the presence of Norberto SorensonEva Shaw, MD.  Broadus Johnawaa Al Rifaie, Medical Scribe. 11/26/2014.  1:45 PM.

## 2014-11-26 NOTE — Patient Instructions (Signed)
Take the delsym 2 tsp in the morning and the tussionex 1 tsp at night to suppress your cough. Use the sudafed (behind the counter) to stop the post-nasal drip. Likely you will continue to feel worse for the next several days but this should start turning around by Thursday and you should be much better by the weekend. If you are getting fever, chills, focal sinus pain, or coughing up sputum - esp if odd colored or blood, shortness of breath, or wheezing come back into clinic immed for further eval.  Upper Respiratory Infection, Adult Most upper respiratory infections (URIs) are a viral infection of the air passages leading to the lungs. A URI affects the nose, throat, and upper air passages. The most common type of URI is nasopharyngitis and is typically referred to as "the common cold." URIs run their course and usually go away on their own. Most of the time, a URI does not require medical attention, but sometimes a bacterial infection in the upper airways can follow a viral infection. This is called a secondary infection. Sinus and middle ear infections are common types of secondary upper respiratory infections. Bacterial pneumonia can also complicate a URI. A URI can worsen asthma and chronic obstructive pulmonary disease (COPD). Sometimes, these complications can require emergency medical care and may be life threatening.  CAUSES Almost all URIs are caused by viruses. A virus is a type of germ and can spread from one person to another.  RISKS FACTORS You may be at risk for a URI if:   You smoke.   You have chronic heart or lung disease.  You have a weakened defense (immune) system.   You are very young or very old.   You have nasal allergies or asthma.  You work in crowded or poorly ventilated areas.  You work in health care facilities or schools. SIGNS AND SYMPTOMS  Symptoms typically develop 2-3 days after you come in contact with a cold virus. Most viral URIs last 7-10 days.  However, viral URIs from the influenza virus (flu virus) can last 14-18 days and are typically more severe. Symptoms may include:   Runny or stuffy (congested) nose.   Sneezing.   Cough.   Sore throat.   Headache.   Fatigue.   Fever.   Loss of appetite.   Pain in your forehead, behind your eyes, and over your cheekbones (sinus pain).  Muscle aches.  DIAGNOSIS  Your health care provider may diagnose a URI by:  Physical exam.  Tests to check that your symptoms are not due to another condition such as:  Strep throat.  Sinusitis.  Pneumonia.  Asthma. TREATMENT  A URI goes away on its own with time. It cannot be cured with medicines, but medicines may be prescribed or recommended to relieve symptoms. Medicines may help:  Reduce your fever.  Reduce your cough.  Relieve nasal congestion. HOME CARE INSTRUCTIONS   Take medicines only as directed by your health care provider.   Gargle warm saltwater or take cough drops to comfort your throat as directed by your health care provider.  Use a warm mist humidifier or inhale steam from a shower to increase air moisture. This may make it easier to breathe.  Drink enough fluid to keep your urine clear or pale yellow.   Eat soups and other clear broths and maintain good nutrition.   Rest as needed.   Return to work when your temperature has returned to normal or as your health care provider  advises. You may need to stay home longer to avoid infecting others. You can also use a face mask and careful hand washing to prevent spread of the virus.  Increase the usage of your inhaler if you have asthma.   Do not use any tobacco products, including cigarettes, chewing tobacco, or electronic cigarettes. If you need help quitting, ask your health care provider. PREVENTION  The best way to protect yourself from getting a cold is to practice good hygiene.   Avoid oral or hand contact with people with cold symptoms.    Wash your hands often if contact occurs.  There is no clear evidence that vitamin C, vitamin E, echinacea, or exercise reduces the chance of developing a cold. However, it is always recommended to get plenty of rest, exercise, and practice good nutrition.  SEEK MEDICAL CARE IF:   You are getting worse rather than better.   Your symptoms are not controlled by medicine.   You have chills.  You have worsening shortness of breath.  You have brown or red mucus.  You have yellow or brown nasal discharge.  You have pain in your face, especially when you bend forward.  You have a fever.  You have swollen neck glands.  You have pain while swallowing.  You have white areas in the back of your throat. SEEK IMMEDIATE MEDICAL CARE IF:   You have severe or persistent:  Headache.  Ear pain.  Sinus pain.  Chest pain.  You have chronic lung disease and any of the following:  Wheezing.  Prolonged cough.  Coughing up blood.  A change in your usual mucus.  You have a stiff neck.  You have changes in your:  Vision.  Hearing.  Thinking.  Mood. MAKE SURE YOU:   Understand these instructions.  Will watch your condition.  Will get help right away if you are not doing well or get worse.   This information is not intended to replace advice given to you by your health care provider. Make sure you discuss any questions you have with your health care provider.   Document Released: 06/30/2000 Document Revised: 05/21/2014 Document Reviewed: 04/11/2013 Elsevier Interactive Patient Education [employer]

## 2018-10-06 ENCOUNTER — Telehealth: Payer: Self-pay

## 2018-10-06 NOTE — Telephone Encounter (Signed)
NOTES ON FILE FROM [facility] [phone], SENT REFERRAL TO SCHEDULING 

## 2018-11-23 ENCOUNTER — Encounter: Payer: Self-pay | Admitting: General Practice

## 2020-03-25 ENCOUNTER — Other Ambulatory Visit: Payer: Self-pay | Admitting: Family Medicine

## 2020-03-25 DIAGNOSIS — R0989 Other specified symptoms and signs involving the circulatory and respiratory systems: Secondary | ICD-10-CM

## 2020-04-15 ENCOUNTER — Ambulatory Visit
Admission: RE | Admit: 2020-04-15 | Discharge: 2020-04-15 | Disposition: A | Discharge status: Home / Self Care | Payer: PRIVATE HEALTH INSURANCE | Source: Ambulatory Visit | Attending: Family Medicine | Admitting: Family Medicine

## 2020-04-15 DIAGNOSIS — R0989 Other specified symptoms and signs involving the circulatory and respiratory systems: Secondary | ICD-10-CM
